# Patient Record
Sex: Female | Born: 1995 | Race: White | Hispanic: No | Marital: Single | Smoking: Never smoker
Health system: Southern US, Community
[De-identification: ages and names within clinical notes are randomized; demographics above are authoritative.]

## PROBLEM LIST (undated history)

## (undated) ENCOUNTER — Ambulatory Visit: Admission: EM | Payer: BC Managed Care – PPO | Source: Home / Self Care

## (undated) DIAGNOSIS — I10 Essential (primary) hypertension: Secondary | ICD-10-CM

---
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
## 2018-11-28 ENCOUNTER — Encounter: Payer: Self-pay | Admitting: Emergency Medicine

## 2018-11-28 ENCOUNTER — Other Ambulatory Visit: Payer: Self-pay

## 2018-11-28 ENCOUNTER — Ambulatory Visit
Admission: EM | Admit: 2018-11-28 | Discharge: 2018-11-28 | Disposition: A | Discharge status: Home / Self Care | Payer: BLUE CROSS/BLUE SHIELD | Attending: Family Medicine | Admitting: Family Medicine

## 2018-11-28 DIAGNOSIS — R197 Diarrhea, unspecified: Secondary | ICD-10-CM | POA: Diagnosis not present

## 2018-11-28 DIAGNOSIS — R0981 Nasal congestion: Secondary | ICD-10-CM

## 2018-11-28 DIAGNOSIS — R05 Cough: Secondary | ICD-10-CM

## 2018-11-28 DIAGNOSIS — J111 Influenza due to unidentified influenza virus with other respiratory manifestations: Secondary | ICD-10-CM

## 2018-11-28 DIAGNOSIS — R112 Nausea with vomiting, unspecified: Secondary | ICD-10-CM

## 2018-11-28 DIAGNOSIS — R69 Illness, unspecified: Secondary | ICD-10-CM | POA: Insufficient documentation

## 2018-11-28 HISTORY — DX: Essential (primary) hypertension: I10

## 2018-11-28 MED ORDER — ONDANSETRON 4 MG PO TBDP: 4.0000 mg | ORAL_TABLET | Freq: Three times a day (TID) | ORAL | 0 refills | Status: DC | PRN

## 2018-11-28 MED ORDER — ONDANSETRON 8 MG PO TBDP
8.0000 mg | ORAL_TABLET | Freq: Once | ORAL | Status: AC
  Administered 2018-11-28: 8 mg via ORAL

## 2018-11-28 NOTE — ED Provider Notes (Signed)
[facility] ____________________________________________  Time seen: Approximately 2:43 PM  I have reviewed the triage vital signs and the nursing notes.   HISTORY  Chief Complaint Sinus Problem   HPI Leah Mcguire is a 23 y.o. female presenting for evaluation of cough, congestion, chills, body aches, vomiting and diarrhea.  Patient reports on Friday she started having vomiting and diarrhea, that continued on Friday as well as Saturday.  States no more vomiting or diarrhea since Saturday.  States Saturday she had increase of runny nose and nasal congestion that has continued.  Does still have some nausea.  States currently having sinus pain and pressure in her cheeks with a lot of runny nose and nasal congestion.  States having accompanying body aches and is felt like she has had a fever but denies known fever.  States feels very tired and rundown.  States prior to Friday she felt fine.  Denies known trigger.  No abnormal foods.  Has taken some over-the-counter medication without resolution.  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  Denies home sick contacts.  Has continued to tolerate fluids well today, and eating some.  States biggest complaint currently is nasal congestion.  Denies accompanying chest pain, shortness of breath or current abdominal pain.  No dysuria.  Has had normal bowel movement since.  Denies other aggravating or alleviating factors.  Reports otherwise doing well.  No LMP recorded. (Menstrual status: IUD).denies pregnancy.   Past Medical History:  Diagnosis Date  . Hypertension     There are no active problems to display for this patient.   History reviewed. No pertinent surgical history.   No current facility-administered medications for this encounter.   Current Outpatient Medications:  .  ondansetron (ZOFRAN ODT) 4 MG disintegrating tablet, Take 1 tablet (4 mg total) by mouth every 8 (eight) hours as needed  for nausea or vomiting., Disp: 15 tablet, Rfl: 0  Allergies Patient has no known allergies.  Family History  Problem Relation Age of Onset  . Healthy Mother   . Healthy Father     Social History Social History   Tobacco Use  . Smoking status: Never Smoker  . Smokeless tobacco: Never Used  Substance Use Topics  . Alcohol use: Yes  . Drug use: Yes    Types: Marijuana    Review of Systems Constitutional: Possible fever.  ENT: No sore throat. Cardiovascular: Denies chest pain. Respiratory: Denies shortness of breath. Gastrointestinal: as above.  Genitourinary: Negative for dysuria. Musculoskeletal: Negative for back pain. Skin: Negative for rash.   ____________________________________________   PHYSICAL EXAM:  VITAL SIGNS: ED Triage Vitals  Enc Vitals Group     BP 11/28/18 1351 (!) 153/90     Pulse Rate 11/28/18 1351 (!) 104     Resp 11/28/18 1351 18     Temp 11/28/18 1351 98.3 F (36.8 C)     Temp Source 11/28/18 1351 Oral     SpO2 11/28/18 1351 98 %     Weight 11/28/18 1348 145 lb (65.8 kg)     Height 11/28/18 1348 5\' 8"  (1.727 m)     Head Circumference --      Peak Flow --      Pain Score 11/28/18 1347 6     Pain Loc --      Pain Edu? --      Excl. in GC? --     Constitutional: Alert and oriented. Well appearing and in no acute distress.  Eyes: Conjunctivae are normal. Head: Atraumatic.Mild tenderness to palpation bilateral frontal and maxillary sinuses. No swelling. No erythema.   Ears: no erythema, normal TMs bilaterally.   Nose: nasal congestion with bilateral nasal turbinate erythema and edema.   Mouth/Throat: Mucous membranes are moist.  Oropharynx non-erythematous.No tonsillar swelling or exudate.  Neck: No stridor.  No cervical spine tenderness to palpation. Hematological/Lymphatic/Immunilogical: No cervical lymphadenopathy. Cardiovascular: Normal rate, regular rhythm. Grossly normal heart sounds.  Good peripheral circulation. Respiratory:  Normal respiratory effort.  No retractions. No wheezes, rales or rhonchi. Good air movement.  Gastrointestinal: Soft and nontender. Normal Bowel sounds. Musculoskeletal: Steady gait.  Neurologic:  Normal speech and language.No gait instability. Skin:  Skin is warm, dry and intact. No rash noted. Psychiatric: Mood and affect are normal. Speech and behavior are normal.  ___________________________________________   LABS (all labs ordered are listed, but only abnormal results are displayed)  Labs Reviewed - No data to display   PROCEDURES Procedures  ________________________________________   INITIAL IMPRESSION / ASSESSMENT AND PLAN / ED COURSE  Pertinent labs & imaging results that were available during my care of the patient were reviewed by me and considered in my medical decision making (see chart for details).  Overall well-appearing patient.  No acute distress.  Suspect recent influenza-like illness.  Past timeframe for Tamiflu.  8 mg ODT Zofran given once in urgent care.  Will Rx PRN Zofran as needed.  Discussed over-the-counter cough and congestion medication, rest, fluids, supportive care.Discussed indication, risks and benefits of medications with patient.  Work note for today and tomorrow given.  Discussed follow up with Primary care physician this week. Discussed follow up and return parameters including no resolution or any worsening concerns. Patient verbalized understanding and agreed to plan.   ____________________________________________   FINAL CLINICAL IMPRESSION(S) / ED DIAGNOSES  Final diagnoses:  Influenza-like illness     ED Discharge Orders         Ordered    ondansetron (ZOFRAN ODT) 4 MG disintegrating tablet  Every 8 hours PRN     11/28/18 1446           Note: This dictation was prepared with Dragon dictation along with smaller phrase technology. Any transcriptional errors that result from this process are unintentional.         Renford DillsMiller,  Elinora Weigand, NP 11/28/18 1625

## 2018-11-28 NOTE — ED Triage Notes (Signed)
Pt c/o sinus pain, pressure, congestion, nausea, and vomiting. Started about 2 days ago.

## 2018-11-28 NOTE — Discharge Instructions (Signed)
Take medication as prescribed. Rest. Drink plenty of fluids. Over the counter medication as discussed.  ° °Follow up with your primary care physician this week as needed. Return to Urgent care for new or worsening concerns.  ° °

## 2018-12-18 ENCOUNTER — Ambulatory Visit
Admission: EM | Admit: 2018-12-18 | Discharge: 2018-12-18 | Disposition: A | Discharge status: Home / Self Care | Payer: BLUE CROSS/BLUE SHIELD | Attending: Family Medicine | Admitting: Family Medicine

## 2018-12-18 DIAGNOSIS — R51 Headache: Secondary | ICD-10-CM | POA: Diagnosis not present

## 2018-12-18 DIAGNOSIS — R519 Headache, unspecified: Secondary | ICD-10-CM

## 2018-12-18 MED ORDER — BUTALBITAL-APAP-CAFFEINE 50-325-40 MG PO TABS: 1.0000 | ORAL_TABLET | Freq: Four times a day (QID) | ORAL | 0 refills | Status: AC | PRN

## 2018-12-18 NOTE — Discharge Instructions (Signed)
Rest  Medication as prescribed.  Take care  Dr. Shaquetta Arcos  
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
## 2022-02-20 ENCOUNTER — Emergency Department: Payer: BC Managed Care – PPO

## 2022-02-20 ENCOUNTER — Emergency Department
Admission: EM | Admit: 2022-02-20 | Discharge: 2022-02-20 | Disposition: A | Discharge status: Home / Self Care | Payer: BC Managed Care – PPO | Attending: Emergency Medicine | Admitting: Emergency Medicine

## 2022-02-20 DIAGNOSIS — S0003XA Contusion of scalp, initial encounter: Secondary | ICD-10-CM | POA: Diagnosis not present

## 2022-02-20 DIAGNOSIS — T7411XA Adult physical abuse, confirmed, initial encounter: Secondary | ICD-10-CM | POA: Diagnosis present

## 2022-02-20 DIAGNOSIS — S8001XA Contusion of right knee, initial encounter: Secondary | ICD-10-CM | POA: Diagnosis not present

## 2022-02-20 DIAGNOSIS — F1090 Alcohol use, unspecified, uncomplicated: Secondary | ICD-10-CM | POA: Diagnosis not present

## 2022-02-20 DIAGNOSIS — M542 Cervicalgia: Secondary | ICD-10-CM | POA: Insufficient documentation

## 2022-02-20 DIAGNOSIS — T07XXXA Unspecified multiple injuries, initial encounter: Secondary | ICD-10-CM

## 2022-02-20 DIAGNOSIS — S0990XA Unspecified injury of head, initial encounter: Secondary | ICD-10-CM

## 2022-02-20 LAB — CBC WITH DIFFERENTIAL/PLATELET
Abs Immature Granulocytes: 0.01 10*3/uL (ref 0.00–0.07)
Basophils Absolute: 0.1 10*3/uL (ref 0.0–0.1)
Basophils Relative: 1 %
Eosinophils Absolute: 0 10*3/uL (ref 0.0–0.5)
Eosinophils Relative: 1 %
HCT: 42.4 % (ref 36.0–46.0)
Hemoglobin: 14.6 g/dL (ref 12.0–15.0)
Immature Granulocytes: 0 %
Lymphocytes Relative: 25 %
Lymphs Abs: 1.5 10*3/uL (ref 0.7–4.0)
MCH: 31.5 pg (ref 26.0–34.0)
MCHC: 34.4 g/dL (ref 30.0–36.0)
MCV: 91.4 fL (ref 80.0–100.0)
Monocytes Absolute: 0.4 10*3/uL (ref 0.1–1.0)
Monocytes Relative: 7 %
Neutro Abs: 4 10*3/uL (ref 1.7–7.7)
Neutrophils Relative %: 66 %
Platelets: 276 10*3/uL (ref 150–400)
RBC: 4.64 MIL/uL (ref 3.87–5.11)
RDW: 12.4 % (ref 11.5–15.5)
WBC: 6 10*3/uL (ref 4.0–10.5)
nRBC: 0 % (ref 0.0–0.2)

## 2022-02-20 LAB — BASIC METABOLIC PANEL
Anion gap: 10 (ref 5–15)
BUN: 7 mg/dL (ref 6–20)
CO2: 24 mmol/L (ref 22–32)
Calcium: 9.2 mg/dL (ref 8.9–10.3)
Chloride: 109 mmol/L (ref 98–111)
Creatinine, Ser: 0.6 mg/dL (ref 0.44–1.00)
GFR, Estimated: >60 mL/min (ref >60)
Glucose, Bld: 93 mg/dL (ref 70–99)
Potassium: 3.3 mmol/L — ABNORMAL LOW (ref 3.5–5.1)
Sodium: 143 mmol/L (ref 135–145)

## 2022-02-20 MED ORDER — IOHEXOL 350 MG/ML SOLN
75.0000 mL | Freq: Once | INTRAVENOUS | Status: AC | PRN
  Administered 2022-02-20: 75 mL via INTRAVENOUS
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
## 2022-02-20 NOTE — ED Notes (Signed)
Pt given new ice pack °
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
## 2023-05-06 ENCOUNTER — Emergency Department
Admission: EM | Admit: 2023-05-06 | Discharge: 2023-05-06 | Disposition: A | Discharge status: Home / Self Care | Payer: BC Managed Care – PPO | Attending: Emergency Medicine | Admitting: Emergency Medicine

## 2023-05-06 ENCOUNTER — Other Ambulatory Visit: Payer: Self-pay

## 2023-05-06 ENCOUNTER — Emergency Department: Payer: BC Managed Care – PPO

## 2023-05-06 DIAGNOSIS — R531 Weakness: Secondary | ICD-10-CM | POA: Insufficient documentation

## 2023-05-06 DIAGNOSIS — I10 Essential (primary) hypertension: Secondary | ICD-10-CM | POA: Insufficient documentation

## 2023-05-06 LAB — URINALYSIS, ROUTINE W REFLEX MICROSCOPIC
Bilirubin Urine: NEGATIVE
Glucose, UA: NEGATIVE mg/dL
Hgb urine dipstick: NEGATIVE
Ketones, ur: NEGATIVE mg/dL
Leukocytes,Ua: NEGATIVE
Nitrite: NEGATIVE
Protein, ur: NEGATIVE mg/dL
Specific Gravity, Urine: 1.002 — ABNORMAL LOW (ref 1.005–1.030)
pH: 7 (ref 5.0–8.0)

## 2023-05-06 LAB — HEPATIC FUNCTION PANEL
ALT: 39 U/L (ref 0–44)
AST: 34 U/L (ref 15–41)
Albumin: 4.4 g/dL (ref 3.5–5.0)
Alkaline Phosphatase: 42 U/L (ref 38–126)
Bilirubin, Direct: <0.1 mg/dL (ref 0.0–0.2)
Total Bilirubin: 0.9 mg/dL (ref 0.3–1.2)
Total Protein: 7.3 g/dL (ref 6.5–8.1)

## 2023-05-06 LAB — CBC
HCT: 36.9 % (ref 36.0–46.0)
Hemoglobin: 13.3 g/dL (ref 12.0–15.0)
MCH: 31.8 pg (ref 26.0–34.0)
MCHC: 36 g/dL (ref 30.0–36.0)
MCV: 88.3 fL (ref 80.0–100.0)
Platelets: 356 10*3/uL (ref 150–400)
RBC: 4.18 MIL/uL (ref 3.87–5.11)
RDW: 11.5 % (ref 11.5–15.5)
WBC: 5.4 10*3/uL (ref 4.0–10.5)
nRBC: 0 % (ref 0.0–0.2)

## 2023-05-06 LAB — BASIC METABOLIC PANEL
Anion gap: 13 (ref 5–15)
BUN: 14 mg/dL (ref 6–20)
CO2: 19 mmol/L — ABNORMAL LOW (ref 22–32)
Calcium: 8.8 mg/dL — ABNORMAL LOW (ref 8.9–10.3)
Chloride: 105 mmol/L (ref 98–111)
Creatinine, Ser: 0.71 mg/dL (ref 0.44–1.00)
GFR, Estimated: >60 mL/min (ref >60)
Glucose, Bld: 96 mg/dL (ref 70–99)
Potassium: 3.8 mmol/L (ref 3.5–5.1)
Sodium: 137 mmol/L (ref 135–145)

## 2023-05-06 LAB — POC URINE PREG, ED: Preg Test, Ur: NEGATIVE

## 2023-05-06 LAB — ETHANOL: Alcohol, Ethyl (B): 227 mg/dL — ABNORMAL HIGH (ref <10)

## 2023-05-06 LAB — TSH: TSH: 1.365 u[IU]/mL (ref 0.350–4.500)

## 2023-05-06 MED ORDER — ONDANSETRON HCL 4 MG/2ML IJ SOLN
4.0000 mg | Freq: Once | INTRAMUSCULAR | Status: AC
Start: 2023-05-06 — End: 2023-05-06
  Administered 2023-05-06: 4 mg via INTRAVENOUS
  Filled 2023-05-06: qty 2

## 2023-05-06 MED ORDER — KETOROLAC TROMETHAMINE 30 MG/ML IJ SOLN
15.0000 mg | Freq: Once | INTRAMUSCULAR | Status: AC
  Administered 2023-05-06: 15 mg via INTRAVENOUS
  Filled 2023-05-06: qty 1

## 2023-05-06 MED ORDER — SODIUM CHLORIDE 0.9 % IV BOLUS
1000.0000 mL | Freq: Once | INTRAVENOUS | Status: AC
  Administered 2023-05-06: 1000 mL via INTRAVENOUS

## 2023-05-06 NOTE — ED Provider Notes (Addendum)
[facility] Provider Note    Event Date/Time   First MD Initiated Contact with Patient 05/06/23 1948     (approximate)  History   Chief Complaint: Weakness, Multiple Complaints, and Fall  HPI  Leah Mcguire is a 27 y.o. female with a past medical history of hypertension who presents to the emergency department with multiple complaints.  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  Patient states she feels shaky at times and off balance she is seeing a primary care doctor tomorrow because she thinks she could have early onset Parkinson's.  Patient does admit to daily marijuana use in addition to daily alcohol use she estimates 7-8 beers each day including today.  Patient states she thinks she is having panic attacks.  She makes it very clear that she does not believe any of her symptoms are due to her alcohol use and is requesting "every test" that we have to check her.  Physical Exam   Triage Vital Signs: ED Triage Vitals [05/06/23 1811]  Enc Vitals Group     BP (!) 151/92     Pulse Rate (!) 128     Resp 17     Temp 98.5 F (36.9 C)     Temp Source Axillary     SpO2 100 %     Weight      Height      Head Circumference      Peak Flow      Pain Score 10     Pain Loc      Pain Edu?      Excl. in GC?     Most recent vital signs: Vitals:   05/06/23 1811  BP: (!) 151/92  Pulse: (!) 128  Resp: 17  Temp: 98.5 F (36.9 C)  SpO2: 100%    General: Awake, no distress.  CV:  Good peripheral perfusion.  Regular rate and rhythm around 80 beats per minute Resp:  Normal effort.  Equal breath sounds bilaterally.  Abd:  No distention.  Soft, nontender.  No rebound or guarding.    ED Results / Procedures / Treatments   EKG  EKG viewed and interpreted by myself  shows a normal sinus rhythm at 98 bpm the narrow QRS, normal axis, normal intervals, no concerning ST changes.  RADIOLOGY  I have reviewed and interpreted chest x-ray images.  No obvious consolidation seen on my evaluation. Radiology has read the chest x-ray is no active cardiopulmonary disease   MEDICATIONS ORDERED IN ED: Medications  sodium chloride 0.9 % bolus 1,000 mL (has no administration in time range)  ondansetron (ZOFRAN) injection 4 mg (has no administration in time range)  ketorolac (TORADOL) 30 MG/ML injection 15 mg (has no administration in time range)     IMPRESSION / MDM / ASSESSMENT AND PLAN / ED COURSE  I reviewed the triage vital signs and the nursing notes.  Patient's presentation is most consistent with acute presentation with potential threat to life or bodily function.  Patient presents emergency department for vague symptoms of feeling weak with chills at times shaking at times, does admit to significant alcohol use 7-8 beers daily including drinking earlier today.  Patient continuously states that it is not the alcohol causing her symptoms and she is concerned that she might  have early onset Parkinson's disease.  Patient has made a PCP appointment tomorrow at [facility] to be evaluated.  Overall the patient appears well in the emergency department reassuring vital signs initial pulse rate was documented at 128 however patient's pulse remains in the 70s throughout my evaluation does not appear to be in any type of withdrawal.  Chemistry reassuring CBC reassuring pregnancy test is negative, urinalysis is normal.  I added on LFTs given the patient's alcohol use LFTs are normal.  I discussed with the patient the need to stop drinking as this very well may be contributing to her symptoms.  Patient does not believe the drinking is contributing but states she is trying to cut back anyways.  Patient also states daily marijuana use which may be contributing as well.  Given the patient's  reassuring medical workup as well as scheduled PCP appointment we will discharge patient have her follow-up with her doctor.  Patient agreeable to plan of care.  She is feeling better after fluids Zofran and Toradol.  Patient requested TSH be added on she will follow-up with her doctor regarding the result.  I have done so we will still discharge per her request.  FINAL CLINICAL IMPRESSION(S) / ED DIAGNOSES   Weakness    Note:  This document was prepared using Dragon voice recognition software and may include unintentional dictation errors.   Minna Antis, MD 05/06/23 2115    Minna Antis, MD 05/06/23 2127

## 2023-05-06 NOTE — ED Notes (Signed)
C/o headache and not feeling any better. Pt has half a liter still infusing.

## 2023-05-06 NOTE — ED Triage Notes (Addendum)
Pt presents to ED with c/o of "not being able to take care of myself". Pt states she drinks everyday. Pt states multiple falls today and states "I probably have a concussion". Pt is A&Ox4 at this time. Pt states she drinks 7-8 beers a day. Pt is not clear on drug use. Pt is tearful in triage stating "you're being mean". Pt educated these substance abuse questions are asked to everyone. EMS states pt was having a panic attack on arrival to scene and pt appears to be doing the same at this time. Pt educated on breathing exercises.   Pt repeatedly attempting to state "these falls and issues she is having is not due to being drunk".

## 2023-05-06 NOTE — Discharge Instructions (Signed)
Has been discussed please drink plenty of fluids.  Please abstain from alcohol and marijuana products as this may be contributing to your symptoms today.  Please follow-up with your primary care doctor as scheduled.  Return to the emergency department for any worsening symptoms or any symptom personally concerning to yourself.  Otherwise you may call the number provided for neurology to arrange a follow-up appointment if needed.
